# Patient Record
Sex: Female | Born: 1970
Health system: Southern US, Community
[De-identification: ages and names within clinical notes are randomized; demographics above are authoritative.]

## PROBLEM LIST (undated history)

## (undated) DIAGNOSIS — I10 Essential (primary) hypertension: Secondary | ICD-10-CM

---

## 2018-09-05 DIAGNOSIS — I10 Essential (primary) hypertension: Secondary | ICD-10-CM

## 2018-09-05 DIAGNOSIS — Z0130 Encounter for examination of blood pressure without abnormal findings: Secondary | ICD-10-CM

## 2018-09-05 LAB — GLUCOSE, POCT (MANUAL RESULT ENTRY): POC Glucose: 70 mg/dl (ref 70–99)

## 2018-09-05 NOTE — Congregational Nurse Program (Signed)
Betty Washington has come in for blood pressure check. Her BP is high, she denies chest pain or discomfort. She denies shortness of breath but does c/o occasional headaches. I have referred her to [facility] and [facility] card assistance as well. Arman Bogus RN BSN PCCN  [phone]

## 2018-09-06 ENCOUNTER — Encounter: Payer: Self-pay | Admitting: Critical Care Medicine

## 2018-09-06 ENCOUNTER — Other Ambulatory Visit: Payer: Self-pay | Admitting: Critical Care Medicine

## 2018-09-06 MED ORDER — AMLODIPINE BESYLATE 10 MG PO TABS: 10.0000 mg | ORAL_TABLET | Freq: Every day | ORAL | 0 refills | Status: DC

## 2018-09-06 MED ORDER — HYDROCHLOROTHIAZIDE 12.5 MG PO TABS: 12.5000 mg | ORAL_TABLET | Freq: Every day | ORAL | 0 refills | Status: DC

## 2018-09-06 NOTE — Progress Notes (Signed)
HTN   bp 172/104

## 2018-09-07 MED FILL — AMLODIPINE BESYLATE 10 MG T: 10 | 30 days supply | Qty: 30 | Fill #0

## 2018-09-07 MED FILL — HYDROCHLOROTHIAZIDE 12.5 MG: 12.5 | 30 days supply | Qty: 30 | Fill #0

## 2018-09-07 NOTE — Progress Notes (Signed)
This is a 48 year old African female originally from [place] for the and here in the [place] for the past 2 years.  The patient is referred by a local Congregational nurse to see here in the [place] clinic.  The patient has hypertension.  Blood pressure by the local nurse was 180/110 and here was 172/104.  The patient denies any previous known history of hypertension.  She denies headache.  There is no lower extremity edema.  She has occasional left leg pain.  There is no chest pain or shortness of breath.  No previous history of stroke or coronary disease.  She works as a Advertising account executive.  She has no insurance.  She lives in housing.  She uses buses for transportation.  She does not smoke or drink alcohol.  No prior surgical history.  No medication allergies.   On exam blood pressure 172/104  pulse is 62 saturation 99% room air  Chest showed to be clear without evidence of wheeze rale or rhonchi Cardiac exam showed a regular rate and rhythm without S3 normal S1-S2 Neurologic exam is intact Skin was clear HEENT exam unremarkable  Impression is that of hypertension newly diagnosed Plan will be to begin amlodipine 10 mg daily and hydrochlorothiazide 12.5 mg daily these were sent to the [facility] outpatient clinic under the Congregational nurse find for pickup and delivery to this patient  We will also have the patient return in 1 week to the [facility] for follow-up and eventually get this patient into a primary care office here locally for further follow-up

## 2018-09-09 NOTE — Congregational Nurse Program (Signed)
Client seen by Dr. Delford Field at clinic for R/O hypertensive event. Rx sent to [facility], CN will secure medication and review administration with client. Follow up scheduled for subsequent clinic date with Dr.Wright

## 2018-09-10 NOTE — Congregational Nurse Program (Signed)
  Dept: [phone]   Congregational Nurse Program Note  Date of Encounter: 09/05/2018  Past Medical History: No past medical history on file.  Encounter Details: CNP Questionnaire - 09/05/18 1608      Questionnaire   Patient Status  Not Applicable    Race  African    Location Patient Served At  [employer]  Not Applicable    Uninsured  Uninsured (NEW 1x/quarter)    Food  No food insecurities    Housing/Utilities  Yes, have permanent housing    Transportation  Yes, need transportation assistance    Interpersonal Safety  Yes, feel physically and emotionally safe where you currently live    Medication  Yes, have medication insecurities    Medical Provider  No    Referrals  [facility];Urgent Care    ED Visit Averted  Yes    Life-Saving Intervention Made  Not Applicable

## 2018-09-12 ENCOUNTER — Telehealth: Payer: Self-pay

## 2018-09-12 NOTE — Telephone Encounter (Signed)
I have called Betty Washington to check on her. She says that medicine is "giving me a headache". She has missed taking her medication on 3 occassions. I have educated her on the importance of being compliant. She verbalized understanding and says that she will take medication as prescribed and will be seeing Dr Delford Field tomorrow for review.

## 2018-09-13 ENCOUNTER — Other Ambulatory Visit: Payer: Self-pay | Admitting: Critical Care Medicine

## 2018-09-13 ENCOUNTER — Encounter: Payer: Self-pay | Admitting: Critical Care Medicine

## 2018-09-13 NOTE — Progress Notes (Signed)
BP 130/92!!!!

## 2018-09-14 NOTE — Progress Notes (Signed)
This is a 48 year old African female originally from [place] for the and here in the [place] for the past 2 years.  The patient is referred by a local Congregational nurse to see here in the [place] clinic.  The patient was seen on 19 February for hypertension and was noted to have Blood pressure by the local nurse was 180/110 and here was 172/104.  We began this patient on amlodipine 10 mg daily and hydrochlorthiazide 12 and half milligrams daily.  The patient has been on these medications for the past week.  She states she has had an increase diuresis.  On exam blood pressure is now 130/92 pulse is 79 saturation 97% room air  Her exam is unremarkable  Impression is that of improvement in hypertension control with amlodipine and hydrochlorthiazide  We will encourage this patient to make an appointment at the community health and wellness center to [place] address her primary care needs and get her accessed for further ongoing care along with laboratory studies

## 2018-09-19 ENCOUNTER — Telehealth: Payer: Self-pay

## 2018-09-19 NOTE — Telephone Encounter (Signed)
I have received a call from Ms Betty Washington to report that she is feeling much better and taking meds as ordered. She has no questions or concerns at this time. Arman Bogus Rn BSN PCCN ##[phone]

## 2018-10-03 ENCOUNTER — Ambulatory Visit: Payer: Self-pay | Admitting: Critical Care Medicine

## 2018-10-03 NOTE — Progress Notes (Deleted)
   Subjective:    Patient ID: Betty Washington, female    DOB: [birth year], 48 y.o.   MRN: [id number]  48 y.o.F here to establish PCP and hx of HTN    This is a 48 year old African female originally from [place] for the and here in the [place] for the past 2 years.  The patient is referred by a local Congregational nurse to see here in the [place] clinic.  The patient was seen on 19 February for hypertension and was noted to have Blood pressure by the local nurse was 180/110 and here was 172/104.  We began this patient on amlodipine 10 mg daily and hydrochlorthiazide 12 and half milligrams daily.  The patient has been on these medications for the past week.  She states she has had an increase diuresis.  On exam blood pressure is now 130/92 pulse is 79 saturation 97% room air  Her exam is unremarkable  Impression is that of improvement in hypertension control with amlodipine and hydrochlorthiazide  We will encourage this patient to make an appointment at the community health and wellness center to [place] address her primary care needs and get her accessed for further ongoing care along with laboratory studies      Review of Systems     Objective:   Physical Exam        Assessment & Plan:

## 2020-12-06 ENCOUNTER — Encounter (HOSPITAL_COMMUNITY): Payer: Self-pay | Admitting: Emergency Medicine

## 2020-12-06 ENCOUNTER — Ambulatory Visit (HOSPITAL_COMMUNITY)
Admission: EM | Admit: 2020-12-06 | Discharge: 2020-12-06 | Disposition: A | Discharge status: Home / Self Care | Payer: Self-pay

## 2020-12-06 ENCOUNTER — Telehealth (HOSPITAL_COMMUNITY): Payer: Self-pay

## 2020-12-06 ENCOUNTER — Ambulatory Visit (INDEPENDENT_AMBULATORY_CARE_PROVIDER_SITE_OTHER): Payer: Self-pay

## 2020-12-06 ENCOUNTER — Other Ambulatory Visit: Payer: Self-pay

## 2020-12-06 DIAGNOSIS — M25512 Pain in left shoulder: Secondary | ICD-10-CM

## 2020-12-06 DIAGNOSIS — M7552 Bursitis of left shoulder: Secondary | ICD-10-CM

## 2020-12-06 HISTORY — DX: Essential (primary) hypertension: I10

## 2020-12-06 MED ORDER — ACETAMINOPHEN 500 MG PO TABS: 1000.0000 mg | ORAL_TABLET | Freq: Three times a day (TID) | ORAL | 0 refills | Status: AC | PRN

## 2020-12-06 MED ORDER — PREDNISONE 20 MG PO TABS: 40.0000 mg | ORAL_TABLET | Freq: Every day | ORAL | 0 refills | Status: AC

## 2020-12-06 MED ORDER — DICLOFENAC SODIUM 1 % EX GEL: 4.0000 g | Freq: Four times a day (QID) | CUTANEOUS | 0 refills | Status: AC

## 2020-12-06 MED ORDER — TIZANIDINE HCL 2 MG PO CAPS: 2.0000 mg | ORAL_CAPSULE | Freq: Three times a day (TID) | ORAL | 0 refills | Status: DC

## 2020-12-06 NOTE — Discharge Instructions (Addendum)
-  You have bursitis of your left shoulder. This is inflammation of the fluid-filled pads (bursae) that act as cushions at the joints. -Start the Prednisone, 2 pills taken at the same time for 5 days in a row.  Try taking this earlier in the day as it can give you energy.  This will help decrease the inflammation and swelling in your shoulder.  Avoid taking ibuprofen on this medication as this can increase your chance of stomach upset and gastritis. -Start the muscle relaxer-Zanaflex (tizanidine), up to 3 times daily for muscle spasms and pain.  This can make you drowsy, so take at bedtime or when you do not need to drive or operate machinery. This will help the tense muscles around your shoulder relax. -Tylenol 1000 mg (2 pills) up to 3 times daily. -Voltaren gel applied to the shoulder, up to 4 times daily. This will help with pain. -Use your sling throughout the day to prevent the shoulder from moving and hurting. -Follow-up with orthopedist if symptoms persist, information below.  Call them on 5/23 if your symptoms are not improving by this time.

## 2020-12-06 NOTE — ED Provider Notes (Signed)
[facility]    CSN: [id number] Arrival date & time: 12/06/20  1218      History   Chief Complaint Chief Complaint  Patient presents with  . Shoulder Pain    HPI Jerriann Manninen is a 50 y.o. female presenting with L shoulder issue x3 days.  Medical history hypertension.  States the left shoulder has been bothering her for some time, she thought this was just irritation from her bra strap.  She states that this is gotten significantly worse over the last 3 days.  States she woke up 3 days ago and the shoulder was swollen and extremely painful.  She is able to move it but with excruciating pain.  Ibuprofen provides minimal relief.  Denies trauma, overuse.  She is right-handed.  Denies numbness and tingling, weakness.  Denies pain elsewhere, chest pain, dizziness, shortness of breath, fevers/chills.  HPI  Past Medical History:  Diagnosis Date  . Hypertension     There are no problems to display for this patient.   History reviewed. No pertinent surgical history.  OB History   No obstetric history on file.      Home Medications    Prior to Admission medications   Medication Sig Start Date End Date Taking? Authorizing Provider  acetaminophen (TYLENOL) 500 MG tablet Take 2 tablets (1,000 mg total) by mouth every 8 (eight) hours as needed for up to 7 days. 12/06/20 12/13/20 Yes Rhys Martini, PA-C  amLODipine (NORVASC) 10 MG tablet Take 1 tablet (10 mg total) by mouth daily. 09/06/18  Yes Storm Frisk, MD  diclofenac Sodium (VOLTAREN) 1 % GEL Apply 4 g topically 4 (four) times daily for 7 days. 12/06/20 12/13/20 Yes Rhys Martini, PA-C  hydrochlorothiazide (HYDRODIURIL) 12.5 MG tablet Take 1 tablet (12.5 mg total) by mouth daily. 09/06/18  Yes Storm Frisk, MD  predniSONE (DELTASONE) 20 MG tablet Take 2 tablets (40 mg total) by mouth daily for 5 days. 12/06/20 12/11/20 Yes Rhys Martini, PA-C  tizanidine (ZANAFLEX) 2 MG capsule Take 1 capsule (2 mg total) by  mouth 3 (three) times daily. 12/06/20  Yes Rhys Martini, PA-C    Family History History reviewed. No pertinent family history.  Social History Social History   Tobacco Use  . Smoking status: Never Smoker  . Smokeless tobacco: Never Used  Vaping Use  . Vaping Use: Never used  Substance Use Topics  . Alcohol use: Never  . Drug use: Never     Allergies   Patient has no known allergies.   Review of Systems Review of Systems  Constitutional: Negative for chills, fever and unexpected weight change.  Respiratory: Negative for chest tightness and shortness of breath.   Cardiovascular: Negative for chest pain and palpitations.  Gastrointestinal: Negative for abdominal pain, diarrhea, nausea and vomiting.  Genitourinary: Negative for decreased urine volume, difficulty urinating and frequency.  Musculoskeletal: Negative for arthralgias, back pain, gait problem, joint swelling, myalgias, neck pain and neck stiffness.       L shoulder pain  Skin: Negative for wound.  Neurological: Negative for dizziness, tremors, seizures, syncope, facial asymmetry, speech difficulty, weakness, light-headedness, numbness and headaches.  All other systems reviewed and are negative.    Physical Exam Triage Vital Signs ED Triage Vitals  Enc Vitals Group     BP 12/06/20 1442 (!) 164/79     Pulse Rate 12/06/20 1442 71     Resp 12/06/20 1442 20     Temp 12/06/20 1442 99  F (37.2 C)     Temp Source 12/06/20 1442 Oral     SpO2 12/06/20 1442 100 %     Weight --      Height --      Head Circumference --      Peak Flow --      Pain Score 12/06/20 1438 10     Pain Loc --      Pain Edu? --      Excl. in GC? --    No data found.  Updated Vital Signs BP (!) 164/79 (BP Location: Right Arm) Comment (BP Location): large cuff  Pulse 71   Temp 99 F (37.2 C) (Oral)   Resp 20   LMP 11/06/2020   SpO2 100%   Visual Acuity Right Eye Distance:   Left Eye Distance:   Bilateral Distance:    Right  Eye Near:   Left Eye Near:    Bilateral Near:     Physical Exam Vitals reviewed.  Constitutional:      General: She is not in acute distress.    Appearance: Normal appearance. She is not ill-appearing or diaphoretic.  HENT:     Head: Normocephalic and atraumatic.  Cardiovascular:     Rate and Rhythm: Normal rate and regular rhythm.     Heart sounds: Normal heart sounds.  Pulmonary:     Effort: Pulmonary effort is normal.     Breath sounds: Normal breath sounds.  Musculoskeletal:     Comments: Exam limited due to patient discomfort.  Left shoulder with effusion to the anterior aspect.  Diffuse tenderness to palpation over shoulder, deltoid muscle, left proximal trapezius muscle.  Range of motion arm is limited due to pain.  No elbow tenderness, wrist tenderness.  Range of motion elbow and wrist intact and without pain.  Grip strength 5 out of 5.  Radial pulse 2+, cap refill is in 2 seconds.  Skin:    General: Skin is warm.     Capillary Refill: Capillary refill takes less than 2 seconds.  Neurological:     General: No focal deficit present.     Mental Status: She is alert and oriented to person, place, and time.  Psychiatric:        Mood and Affect: Mood normal. Affect is tearful.        Behavior: Behavior normal.        Thought Content: Thought content normal.        Judgment: Judgment normal.      UC Treatments / Results  Labs (all labs ordered are listed, but only abnormal results are displayed) Labs Reviewed - No data to display  EKG   Radiology DG Shoulder Left  Result Date: 12/06/2020 CLINICAL DATA:  Acute left shoulder pain without known injury. EXAM: LEFT SHOULDER - 2+ VIEW COMPARISON:  None. FINDINGS: There is no evidence of fracture or dislocation. There is no evidence of arthropathy. Lobular calcification is seen near the superior portion of the glenoid fossa which may represent bursal calcification. IMPRESSION: No fracture or dislocation is noted. Possible  large bursal calcification is noted suggesting bursitis. Electronically Signed   By: Lupita Raider M.D.   On: 12/06/2020 15:26    Procedures Procedures (including critical care time)  Medications Ordered in UC Medications - No data to display  Initial Impression / Assessment and Plan / UC Course  I have reviewed the triage vital signs and the nursing notes.  Pertinent labs & imaging results that were available  during my care of the patient were reviewed by me and considered in my medical decision making (see chart for details).     This patient is a 50 year old female presenting with suspected left shoulder bursitis.  Neurovascularly intact.  Xray L shoulder - No fracture or dislocation is noted. Possible large bursal calcification is noted suggesting bursitis.  Placed in sling.  Voltaren gel, Tylenol, prednisone, Zanaflex.  Follow-up with Ortho.  Final Clinical Impressions(s) / UC Diagnoses   Final diagnoses:  Acute bursitis of left shoulder  Acute pain of left shoulder     Discharge Instructions     -You have bursitis of your left shoulder. This is inflammation of the fluid-filled pads (bursae) that act as cushions at the joints. -Start the Prednisone, 2 pills taken at the same time for 5 days in a row.  Try taking this earlier in the day as it can give you energy.  This will help decrease the inflammation and swelling in your shoulder.  Avoid taking ibuprofen on this medication as this can increase your chance of stomach upset and gastritis. -Start the muscle relaxer-Zanaflex (tizanidine), up to 3 times daily for muscle spasms and pain.  This can make you drowsy, so take at bedtime or when you do not need to drive or operate machinery. This will help the tense muscles around your shoulder relax. -Tylenol 1000 mg (2 pills) up to 3 times daily. -Voltaren gel applied to the shoulder, up to 4 times daily. This will help with pain. -Use your sling throughout the day to prevent  the shoulder from moving and hurting. -Follow-up with orthopedist if symptoms persist, information below.  Call them on 5/23 if your symptoms are not improving by this time.     ED Prescriptions    Medication Sig Dispense Auth. Provider   acetaminophen (TYLENOL) 500 MG tablet Take 2 tablets (1,000 mg total) by mouth every 8 (eight) hours as needed for up to 7 days. 30 tablet Rhys Martini, PA-C   predniSONE (DELTASONE) 20 MG tablet Take 2 tablets (40 mg total) by mouth daily for 5 days. 10 tablet Rhys Martini, PA-C   diclofenac Sodium (VOLTAREN) 1 % GEL Apply 4 g topically 4 (four) times daily for 7 days. 120 g Rhys Martini, PA-C   tizanidine (ZANAFLEX) 2 MG capsule Take 1 capsule (2 mg total) by mouth 3 (three) times daily. 21 capsule Rhys Martini, PA-C     PDMP not reviewed this encounter.   Rhys Martini, PA-C 12/06/20 1617

## 2020-12-06 NOTE — ED Triage Notes (Signed)
Patient reports waking from sleep on Friday and having left shoulder pain.  Pain has continued.  Patient reports pain in left shoulder to left elbow.  Left radial pulse 2 +,  Patient wiggles fingers without issue.  States she cannot move elbow or shoulder

## 2021-01-22 ENCOUNTER — Ambulatory Visit: Payer: Self-pay | Admitting: Physician Assistant

## 2021-02-04 ENCOUNTER — Other Ambulatory Visit: Payer: Self-pay

## 2021-02-04 ENCOUNTER — Encounter (INDEPENDENT_AMBULATORY_CARE_PROVIDER_SITE_OTHER): Payer: Self-pay | Admitting: Primary Care

## 2021-02-04 ENCOUNTER — Other Ambulatory Visit (HOSPITAL_COMMUNITY): Payer: Self-pay

## 2021-02-04 ENCOUNTER — Ambulatory Visit (INDEPENDENT_AMBULATORY_CARE_PROVIDER_SITE_OTHER): Payer: Self-pay | Admitting: Primary Care

## 2021-02-04 VITALS — BP 149/86 | HR 65 | Temp 98.1°F | Wt 210.4 lb

## 2021-02-04 DIAGNOSIS — Z13220 Encounter for screening for lipoid disorders: Secondary | ICD-10-CM

## 2021-02-04 DIAGNOSIS — E049 Nontoxic goiter, unspecified: Secondary | ICD-10-CM

## 2021-02-04 DIAGNOSIS — I10 Essential (primary) hypertension: Secondary | ICD-10-CM

## 2021-02-04 DIAGNOSIS — Z7689 Persons encountering health services in other specified circumstances: Secondary | ICD-10-CM

## 2021-02-04 MED ORDER — HYDROCHLOROTHIAZIDE 12.5 MG PO TABS
12.5000 mg | ORAL_TABLET | Freq: Every day | ORAL | 1 refills | Status: DC
  Filled 2021-02-04: qty 30, 30d supply, fill #0

## 2021-02-04 MED ORDER — AMLODIPINE BESYLATE 10 MG PO TABS
10.0000 mg | ORAL_TABLET | Freq: Every day | ORAL | 1 refills | Status: AC
  Filled 2021-02-04: qty 30, 30d supply, fill #0

## 2021-02-04 NOTE — Addendum Note (Signed)
Addended by: Gwinda Passe on: 02/04/2021 03:07 PM   Modules accepted: Orders

## 2021-02-04 NOTE — Patient Instructions (Signed)

## 2021-02-04 NOTE — Progress Notes (Addendum)
  [facility]   Subjective:   [name] is a 50 y.o. female presents for establish care. Denies any questions , problems or concerns.  Past Medical History:  Diagnosis Date   Hypertension      No Known Allergies    Current Outpatient Medications on File Prior to Visit  Medication Sig Dispense Refill   amLODipine (NORVASC) 10 MG tablet Take 1 tablet (10 mg total) by mouth daily. (Patient not taking: Reported on 02/04/2021) 30 tablet 0   hydrochlorothiazide (HYDRODIURIL) 12.5 MG tablet Take 1 tablet (12.5 mg total) by mouth daily. (Patient not taking: Reported on 02/04/2021) 30 tablet 0   No current facility-administered medications on file prior to visit.     Review of System: Review of Systems  Neurological:  Positive for headaches.  All other systems reviewed and are negative.  Objective:  BP (!) 149/86 (BP Location: Right Arm, Patient Position: Sitting, Cuff Size: Large)   Pulse 65   Temp 98.1 F (36.7 C) (Temporal)   Wt 210 lb 6.4 oz (95.4 kg)   SpO2 94%   Filed Weights   02/04/21 1425  Weight: 210 lb 6.4 oz (95.4 kg)    Physical Exam: General Appearance: Well nourished, in no apparent distress. Eyes: PERRLA, EOMs, conjunctiva no swelling or erythema Sinuses: No Frontal/maxillary tenderness ENT/Mouth: Ext aud canals clear, TMs without erythema, bulging. No erythema, swelling, or exudate on post pharynx.  Tonsils not swollen or erythematous. Hearing normal.  Neck: Supple, goiter  Respiratory: Respiratory effort normal, BS equal bilaterally without rales, rhonchi, wheezing or stridor.  Cardio: RRR with no MRGs. Brisk peripheral pulses without edema.  Abdomen: Soft, + BS.  Non tender, no guarding, rebound, hernias, masses. Lymphatics: Non tender without lymphadenopathy.  Musculoskeletal: Full ROM, 5/5 strength, normal gait.  Skin: Warm, dry without rashes, lesions, ecchymosis.  Neuro: Cranial nerves intact. Normal muscle tone, no cerebellar  symptoms. Sensation intact.  Psych: Awake and oriented X 3, normal affect, Insight and Judgment appropriate.    Assessment:     This note has been created with Education officer, environmental. Any transcriptional errors are unintentional.   Grayce Sessions, NP 02/04/2021, 2:29 PM

## 2021-03-11 ENCOUNTER — Encounter (INDEPENDENT_AMBULATORY_CARE_PROVIDER_SITE_OTHER): Payer: Self-pay | Admitting: Primary Care

## 2021-03-11 ENCOUNTER — Other Ambulatory Visit (INDEPENDENT_AMBULATORY_CARE_PROVIDER_SITE_OTHER): Payer: Self-pay

## 2021-03-11 ENCOUNTER — Other Ambulatory Visit: Payer: Self-pay

## 2021-03-11 ENCOUNTER — Other Ambulatory Visit (HOSPITAL_COMMUNITY): Payer: Self-pay

## 2021-03-11 ENCOUNTER — Ambulatory Visit (INDEPENDENT_AMBULATORY_CARE_PROVIDER_SITE_OTHER): Payer: Self-pay | Admitting: Primary Care

## 2021-03-11 VITALS — BP 165/98 | HR 59 | Temp 97.7°F | Ht 68.0 in | Wt 209.6 lb

## 2021-03-11 DIAGNOSIS — E049 Nontoxic goiter, unspecified: Secondary | ICD-10-CM

## 2021-03-11 DIAGNOSIS — I10 Essential (primary) hypertension: Secondary | ICD-10-CM

## 2021-03-11 DIAGNOSIS — Z1211 Encounter for screening for malignant neoplasm of colon: Secondary | ICD-10-CM

## 2021-03-11 DIAGNOSIS — Z13220 Encounter for screening for lipoid disorders: Secondary | ICD-10-CM

## 2021-03-11 MED ORDER — HYDROCHLOROTHIAZIDE 25 MG PO TABS
25.0000 mg | ORAL_TABLET | Freq: Every day | ORAL | 3 refills | Status: AC
  Filled 2021-03-11: qty 90, 90d supply, fill #0

## 2021-03-11 NOTE — Progress Notes (Signed)
[facility]   Betty Washington is a 50 y.o. female presents for hypertension evaluation, Denies shortness of breath, headaches, chest pain or lower extremity edema, sudden onset, vision changes, unilateral weakness, dizziness, paresthesias .  Patient did not take her blood pressure medication today because she came in fasting for labs and did not know if she needed to take medication prior to visit.  She does take her blood pressures at home and her systolic ranges from 130-142 and her diastolic 80-90 ranges from  Patient reports adherence with medications.  Dietary habits include: changed stop using mega cube Exercise habits include:walking Family / Social history: No   Past Medical History:  Diagnosis Date   Hypertension    No past surgical history on file. No Known Allergies Current Outpatient Medications on File Prior to Visit  Medication Sig Dispense Refill   amLODipine (NORVASC) 10 MG tablet Take 1 tablet (10 mg total) by mouth daily. 90 tablet 1   hydrochlorothiazide (HYDRODIURIL) 12.5 MG tablet Take 1 tablet (12.5 mg total) by mouth daily. 90 tablet 1   No current facility-administered medications on file prior to visit.   Social History   Socioeconomic History   Marital status: Legally Separated    Spouse name: Not on file   Number of children: Not on file   Years of education: Not on file   Highest education level: Not on file  Occupational History   Not on file  Tobacco Use   Smoking status: Never   Smokeless tobacco: Never  Vaping Use   Vaping Use: Never used  Substance and Sexual Activity   Alcohol use: Never   Drug use: Never   Sexual activity: Not on file  Other Topics Concern   Not on file  Social History Narrative   Not on file   Social Determinants of Health   Financial Resource Strain: Not on file  Food Insecurity: Not on file  Transportation Needs: Not on file  Physical Activity: Not on file  Stress: Not on file  Social  Connections: Not on file  Intimate Partner Violence: Not on file   No family history on file.   OBJECTIVE:  Vitals:   03/11/21 0957 03/11/21 1012  BP: (!) 163/103 (!) 165/98  Pulse: (!) 58 (!) 59  Temp: 97.7 F (36.5 C)   TempSrc: Temporal   SpO2: 95%   Weight: 209 lb 9.6 oz (95.1 kg)   Height: 5\' 8"  (1.727 m)     Physical Exam Vitals reviewed.  Constitutional:      Appearance: She is obese.  HENT:     Head: Normocephalic.     Right Ear: Tympanic membrane and external ear normal.     Left Ear: Tympanic membrane and external ear normal.     Nose: Nose normal.  Eyes:     Extraocular Movements: Extraocular movements intact.     Pupils: Pupils are equal, round, and reactive to light.  Neck:     Comments: Thick  Cardiovascular:     Rate and Rhythm: Normal rate and regular rhythm.  Pulmonary:     Effort: Pulmonary effort is normal.     Breath sounds: Normal breath sounds.  Abdominal:     General: Bowel sounds are normal. There is distension.     Palpations: Abdomen is soft.  Musculoskeletal:        General: Normal range of motion.     Cervical back: Normal range of motion and neck supple.  Skin:  General: Skin is warm and dry.  Neurological:     Mental Status: She is alert and oriented to person, place, and time.  Psychiatric:        Mood and Affect: Mood normal.        Behavior: Behavior normal.        Thought Content: Thought content normal.        Judgment: Judgment normal.     ROS  Last 3 Office BP readings: BP Readings from Last 3 Encounters:  03/11/21 (!) 165/98  02/04/21 (!) 149/86  12/06/20 (!) 164/79    BMET No results found for: NA, K, CL, CO2, GLUCOSE, BUN, CREATININE, CALCIUM, GFRNONAA, GFRAA  Renal function: CrCl cannot be calculated (No successful lab value found.).  Clinical ASCVD: No  The ASCVD Risk score Denman George DC Jr., et al., 2013) failed to calculate for the following reasons:   Cannot find a previous HDL lab   Cannot find a  previous total cholesterol lab  Khyler was seen today for blood pressure check.  Diagnoses and all orders for this visit:  ASSESSMENT & PLAN:   Essential hypertension -Counseled on lifestyle modifications for blood pressure control including reduced dietary sodium, increased exercise, weight reduction and adequate sleep. Also, educated patient about the risk for cardiovascular events, stroke and heart attack. Also counseled patient about the importance of medication adherence. If you participate in smoking, it is important to stop using tobacco as this will increase the risks associated with uncontrolled blood pressure.   -Hypertension longstanding  diagnosed currently amlodipine 10mg  and HCTZ 12.5 mg on current medications. Patient is adherent with current medications.  hydrochlorothiazide (HYDRODIURIL) 25 MG tablet; Take 1 tablet (25 mg total) by mouth daily.  Goal BP:  For patients younger than 60: Goal BP < 130/80. For patients 60 and older: Goal BP < 140/90. For patients with diabetes: Goal BP < 130/80. Your most recent BP: 165/98  Minimize salt intake. Minimize alcohol intake -      Colon cancer screening Fecal occult blood, imunochemical; Future  -    This note has been created with . Any transcriptional errors are unintentional.   Education officer, environmental, NP 03/11/2021, 10:18 AM

## 2021-03-11 NOTE — Patient Instructions (Addendum)
Hydrochlorothiazide 12.5mg  has been increased to 25 mg daily.  She will take 2(12.5 mg tablets) until gone and the prescription she picks up will be 25 mg HCTZ take 1 daily.  Hypertension, Adult High blood pressure (hypertension) is when the force of blood pumping through the arteries is too strong. The arteries are the blood vessels that carry blood from the heart throughout the body. Hypertension forces the heart to work harder to pump blood and may cause arteries to become narrow or stiff. Untreated or uncontrolled hypertension can cause a heart attack, heart failure, a stroke, kidney disease, and otherproblems. A blood pressure reading consists of a higher number over a lower number. Ideally, your blood pressure should be below 120/80. The first ("top") number is called the systolic pressure. It is a measure of the pressure in your arteries as your heart beats. The second ("bottom") number is called the diastolic pressure. It is a measure of the pressure in your arteries as theheart relaxes. What are the causes? The exact cause of this condition is not known. There are some conditions thatresult in or are related to high blood pressure. What increases the risk? Some risk factors for high blood pressure are under your control. The following factors may make you more likely to develop this condition: Smoking. Having type 2 diabetes mellitus, high cholesterol, or both. Not getting enough exercise or physical activity. Being overweight. Having too much fat, sugar, calories, or salt (sodium) in your diet. Drinking too much alcohol. Some risk factors for high blood pressure may be difficult or impossible to change. Some of these factors include: Having chronic kidney disease. Having a family history of high blood pressure. Age. Risk increases with age. Race. You may be at higher risk if you are African American. Gender. Men are at higher risk than women before age 85. After age 46, women are at  higher risk than men. Having obstructive sleep apnea. Stress. What are the signs or symptoms? High blood pressure may not cause symptoms. Very high blood pressure (hypertensive crisis) may cause: Headache. Anxiety. Shortness of breath. Nosebleed. Nausea and vomiting. Vision changes. Severe chest pain. Seizures. How is this diagnosed? This condition is diagnosed by measuring your blood pressure while you are seated, with your arm resting on a flat surface, your legs uncrossed, and your feet flat on the floor. The cuff of the blood pressure monitor will be placed directly against the skin of your upper arm at the level of your heart. It should be measured at least twice using the same arm. Certain conditions cancause a difference in blood pressure between your right and left arms. Certain factors can cause blood pressure readings to be lower or higher than normal for a short period of time: When your blood pressure is higher when you are in a health care provider's office than when you are at home, this is called white coat hypertension. Most people with this condition do not need medicines. When your blood pressure is higher at home than when you are in a health care provider's office, this is called masked hypertension. Most people with this condition may need medicines to control blood pressure. If you have a high blood pressure reading during one visit or you have normal blood pressure with other risk factors, you may be asked to: Return on a different day to have your blood pressure checked again. Monitor your blood pressure at home for 1 week or longer. If you are diagnosed with hypertension, you may  have other blood or imaging tests to help your health care provider understand your overall risk for otherconditions. How is this treated? This condition is treated by making healthy lifestyle changes, such as eating healthy foods, exercising more, and reducing your alcohol intake. Your health  care provider may prescribe medicine if lifestyle changes are not enough to get your blood pressure under control, and if: Your systolic blood pressure is above 130. Your diastolic blood pressure is above 80. Your personal target blood pressure may vary depending on your medicalconditions, your age, and other factors. Follow these instructions at home: Eating and drinking  Eat a diet that is high in fiber and potassium, and low in sodium, added sugar, and fat. An example eating plan is called the DASH (Dietary Approaches to Stop Hypertension) diet. To eat this way: Eat plenty of fresh fruits and vegetables. Try to fill one half of your plate at each meal with fruits and vegetables. Eat whole grains, such as whole-wheat pasta, brown rice, or whole-grain bread. Fill about one fourth of your plate with whole grains. Eat or drink low-fat dairy products, such as skim milk or low-fat yogurt. Avoid fatty cuts of meat, processed or cured meats, and poultry with skin. Fill about one fourth of your plate with lean proteins, such as fish, chicken without skin, beans, eggs, or tofu. Avoid pre-made and processed foods. These tend to be higher in sodium, added sugar, and fat. Reduce your daily sodium intake. Most people with hypertension should eat less than 1,500 mg of sodium a day. Do not drink alcohol if: Your health care provider tells you not to drink. You are pregnant, may be pregnant, or are planning to become pregnant. If you drink alcohol: Limit how much you use to: 0-1 drink a day for women. 0-2 drinks a day for men. Be aware of how much alcohol is in your drink. In the [place] one drink equals one 12 oz bottle of beer (355 mL), one 5 oz glass of wine (148 mL), or one 1 oz glass of hard liquor (44 mL).  Lifestyle  Work with your health care provider to maintain a healthy body weight or to lose weight. Ask what an ideal weight is for you. Get at least 30 minutes of exercise most days of the week.  Activities may include walking, swimming, or biking. Include exercise to strengthen your muscles (resistance exercise), such as Pilates or lifting weights, as part of your weekly exercise routine. Try to do these types of exercises for 30 minutes at least 3 days a week. Do not use any products that contain nicotine or tobacco, such as cigarettes, e-cigarettes, and chewing tobacco. If you need help quitting, ask your health care provider. Monitor your blood pressure at home as told by your health care provider. Keep all follow-up visits as told by your health care provider. This is important.  Medicines Take over-the-counter and prescription medicines only as told by your health care provider. Follow directions carefully. Blood pressure medicines must be taken as prescribed. Do not skip doses of blood pressure medicine. Doing this puts you at risk for problems and can make the medicine less effective. Ask your health care provider about side effects or reactions to medicines that you should watch for. Contact a health care provider if you: Think you are having a reaction to a medicine you are taking. Have headaches that keep coming back (recurring). Feel dizzy. Have swelling in your ankles. Have trouble with your vision. Get help  right away if you: Develop a severe headache or confusion. Have unusual weakness or numbness. Feel faint. Have severe pain in your chest or abdomen. Vomit repeatedly. Have trouble breathing. Summary Hypertension is when the force of blood pumping through your arteries is too strong. If this condition is not controlled, it may put you at risk for serious complications. Your personal target blood pressure may vary depending on your medical conditions, your age, and other factors. For most people, a normal blood pressure is less than 120/80. Hypertension is treated with lifestyle changes, medicines, or a combination of both. Lifestyle changes include losing weight,  eating a healthy, low-sodium diet, exercising more, and limiting alcohol. This information is not intended to replace advice given to you by your health care provider. Make sure you discuss any questions you have with your healthcare provider. Document Revised: 03/15/2018 Document Reviewed: 03/15/2018 Elsevier Patient Education  2022 [employer].

## 2021-03-12 ENCOUNTER — Other Ambulatory Visit (HOSPITAL_COMMUNITY): Payer: Self-pay

## 2021-03-12 ENCOUNTER — Other Ambulatory Visit (INDEPENDENT_AMBULATORY_CARE_PROVIDER_SITE_OTHER): Payer: Self-pay | Admitting: Primary Care

## 2021-03-12 DIAGNOSIS — E782 Mixed hyperlipidemia: Secondary | ICD-10-CM

## 2021-03-12 LAB — CBC WITH DIFFERENTIAL/PLATELET
Basophils Absolute: 0 10*3/uL (ref 0.0–0.2)
Basos: 1 %
EOS (ABSOLUTE): 0.1 10*3/uL (ref 0.0–0.4)
Eos: 4 %
Hematocrit: 38.2 % (ref 34.0–46.6)
Hemoglobin: 12.4 g/dL (ref 11.1–15.9)
Immature Grans (Abs): 0 10*3/uL (ref 0.0–0.1)
Immature Granulocytes: 0 %
Lymphocytes Absolute: 1.7 10*3/uL (ref 0.7–3.1)
Lymphs: 46 %
MCH: 26.1 pg — ABNORMAL LOW (ref 26.6–33.0)
MCHC: 32.5 g/dL (ref 31.5–35.7)
MCV: 80 fL (ref 79–97)
Monocytes Absolute: 0.2 10*3/uL (ref 0.1–0.9)
Monocytes: 6 %
Neutrophils Absolute: 1.6 10*3/uL (ref 1.4–7.0)
Neutrophils: 43 %
Platelets: 246 10*3/uL (ref 150–450)
RBC: 4.75 x10E6/uL (ref 3.77–5.28)
RDW: 13.6 % (ref 11.7–15.4)
WBC: 3.6 10*3/uL (ref 3.4–10.8)

## 2021-03-12 LAB — LIPID PANEL
Chol/HDL Ratio: 3.9 ratio (ref 0.0–4.4)
Cholesterol, Total: 243 mg/dL — ABNORMAL HIGH (ref 100–199)
HDL: 63 mg/dL (ref >39)
LDL Chol Calc (NIH): 161 mg/dL — ABNORMAL HIGH (ref 0–99)
Triglycerides: 106 mg/dL (ref 0–149)
VLDL Cholesterol Cal: 19 mg/dL (ref 5–40)

## 2021-03-12 LAB — CMP14+EGFR
ALT: 18 IU/L (ref 0–32)
AST: 22 IU/L (ref 0–40)
Albumin/Globulin Ratio: 1.4 (ref 1.2–2.2)
Albumin: 4.7 g/dL (ref 3.8–4.8)
Alkaline Phosphatase: 63 IU/L (ref 44–121)
BUN/Creatinine Ratio: 19 (ref 9–23)
BUN: 11 mg/dL (ref 6–24)
Bilirubin Total: <0.2 mg/dL (ref 0.0–1.2)
CO2: 22 mmol/L (ref 20–29)
Calcium: 9.1 mg/dL (ref 8.7–10.2)
Chloride: 104 mmol/L (ref 96–106)
Creatinine, Ser: 0.58 mg/dL (ref 0.57–1.00)
Globulin, Total: 3.4 g/dL (ref 1.5–4.5)
Glucose: 95 mg/dL (ref 65–99)
Potassium: 4.5 mmol/L (ref 3.5–5.2)
Sodium: 140 mmol/L (ref 134–144)
Total Protein: 8.1 g/dL (ref 6.0–8.5)
eGFR: 111 mL/min/{1.73_m2} (ref >59)

## 2021-03-12 LAB — TSH+FREE T4
Free T4: 0.94 ng/dL (ref 0.82–1.77)
TSH: 1.21 u[IU]/mL (ref 0.450–4.500)

## 2021-03-12 MED ORDER — ATORVASTATIN CALCIUM 20 MG PO TABS
20.0000 mg | ORAL_TABLET | Freq: Every day | ORAL | 3 refills | Status: AC
  Filled 2021-03-12: qty 90, 90d supply, fill #0

## 2021-03-13 ENCOUNTER — Telehealth (INDEPENDENT_AMBULATORY_CARE_PROVIDER_SITE_OTHER): Payer: Self-pay

## 2021-03-13 ENCOUNTER — Encounter (INDEPENDENT_AMBULATORY_CARE_PROVIDER_SITE_OTHER): Payer: Self-pay

## 2021-03-13 NOTE — Addendum Note (Signed)
Addended by: Rogene Houston on: 03/13/2021 11:06 AM   Modules accepted: Orders

## 2021-03-13 NOTE — Telephone Encounter (Signed)
-----   Message from Grayce Sessions, NP sent at 03/12/2021  5:30 PM EDT ----- Labs are normal except cholesterol. This can lead to heart attack and stroke. To lower your number you can decrease your fatty foods, red meat, cheese, milk and increase fiber like whole grains and veggies. Sent in cholesterol medication take at bedtime atorvastatin 20mg

## 2021-03-13 NOTE — Telephone Encounter (Signed)
Attempted to reach patient; no answer. Left voicemail asking patient to return call. Per DPR contacted patients aunt. She was informed that labs were normal except cholesterol. Medication sent to help lower cholesterol. Take at bedtime. Advised to eat more fiber and whole grains an veggies. She will inform patient of results and instructions on medication and diet Tallyn Holroyd Budd Palmer, CMA

## 2021-03-16 LAB — FECAL OCCULT BLOOD, IMMUNOCHEMICAL: Fecal Occult Bld: NEGATIVE

## 2021-03-20 ENCOUNTER — Other Ambulatory Visit (HOSPITAL_COMMUNITY): Payer: Self-pay

## 2021-03-20 ENCOUNTER — Telehealth (INDEPENDENT_AMBULATORY_CARE_PROVIDER_SITE_OTHER): Payer: Self-pay

## 2021-03-20 NOTE — Telephone Encounter (Signed)
-----   Message from Grayce Sessions, NP sent at 03/16/2021  3:58 PM EDT ----- Negative FOBT repeat next year

## 2021-03-20 NOTE — Telephone Encounter (Signed)
Per DPR negative fecal results given to patients aunt. She will inform patient of results. Maryjean Morn, CMA

## 2021-04-06 ENCOUNTER — Ambulatory Visit (INDEPENDENT_AMBULATORY_CARE_PROVIDER_SITE_OTHER): Payer: Self-pay | Admitting: Primary Care

## 2021-04-08 ENCOUNTER — Ambulatory Visit (INDEPENDENT_AMBULATORY_CARE_PROVIDER_SITE_OTHER): Payer: Self-pay | Admitting: Primary Care

## 2021-04-28 ENCOUNTER — Ambulatory Visit (INDEPENDENT_AMBULATORY_CARE_PROVIDER_SITE_OTHER): Payer: Self-pay | Admitting: Primary Care

## 2021-12-02 ENCOUNTER — Ambulatory Visit (INDEPENDENT_AMBULATORY_CARE_PROVIDER_SITE_OTHER): Payer: Self-pay

## 2021-12-02 NOTE — Telephone Encounter (Signed)
Pt was given results from [facility] per Marcelino Duster, NP on 12/02/21. Pt didn't have any further questions and no further assistance needed.  ?

## 2021-12-02 NOTE — Telephone Encounter (Signed)
Patient called, left VM to return the call to the office to discuss lab results with a nurse. ?Summary: lab results  ? Pt is  calling in to get results of labs done 08/26. Please call back  ?  ? ? ?

## 2022-08-30 ENCOUNTER — Telehealth: Payer: Self-pay

## 2022-08-30 NOTE — Telephone Encounter (Signed)
Telephoned patient at mobile number. Left a voice message with [facility] ([facility]) contact information.

## 2022-11-24 IMAGING — DX DG SHOULDER 2+V*L*
3 series · 3 of 3 positions shown · non-contrast
Comparison: None.

CLINICAL DATA: Acute left shoulder pain without known injury.

EXAM:
LEFT SHOULDER - 2+ VIEW

[shoulder ap]
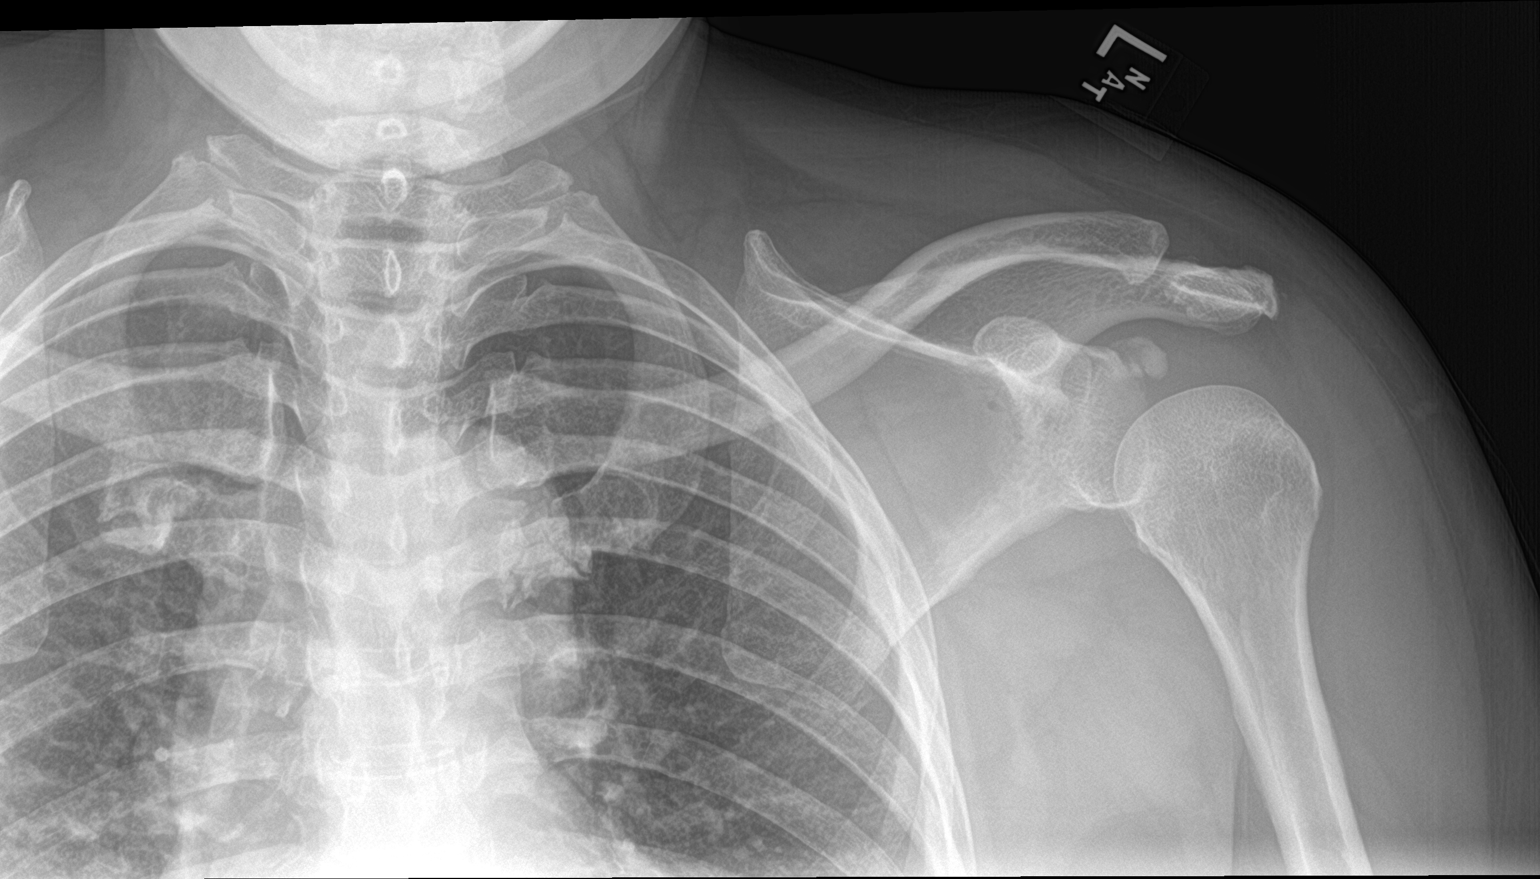

[shoulder grashey]
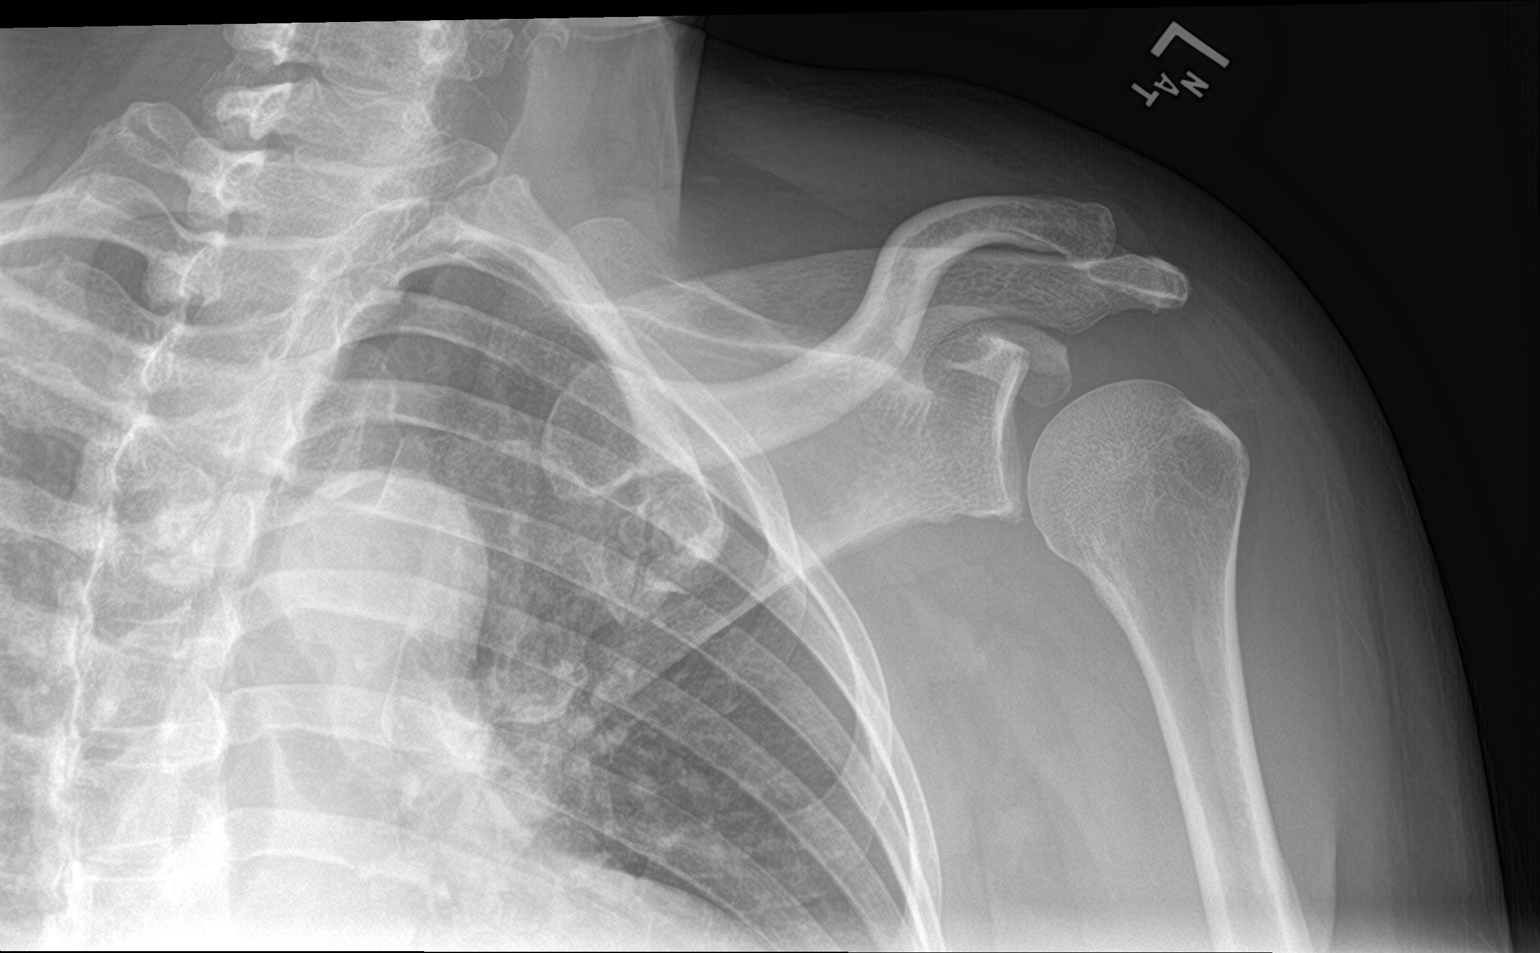

[shoulder y-view]
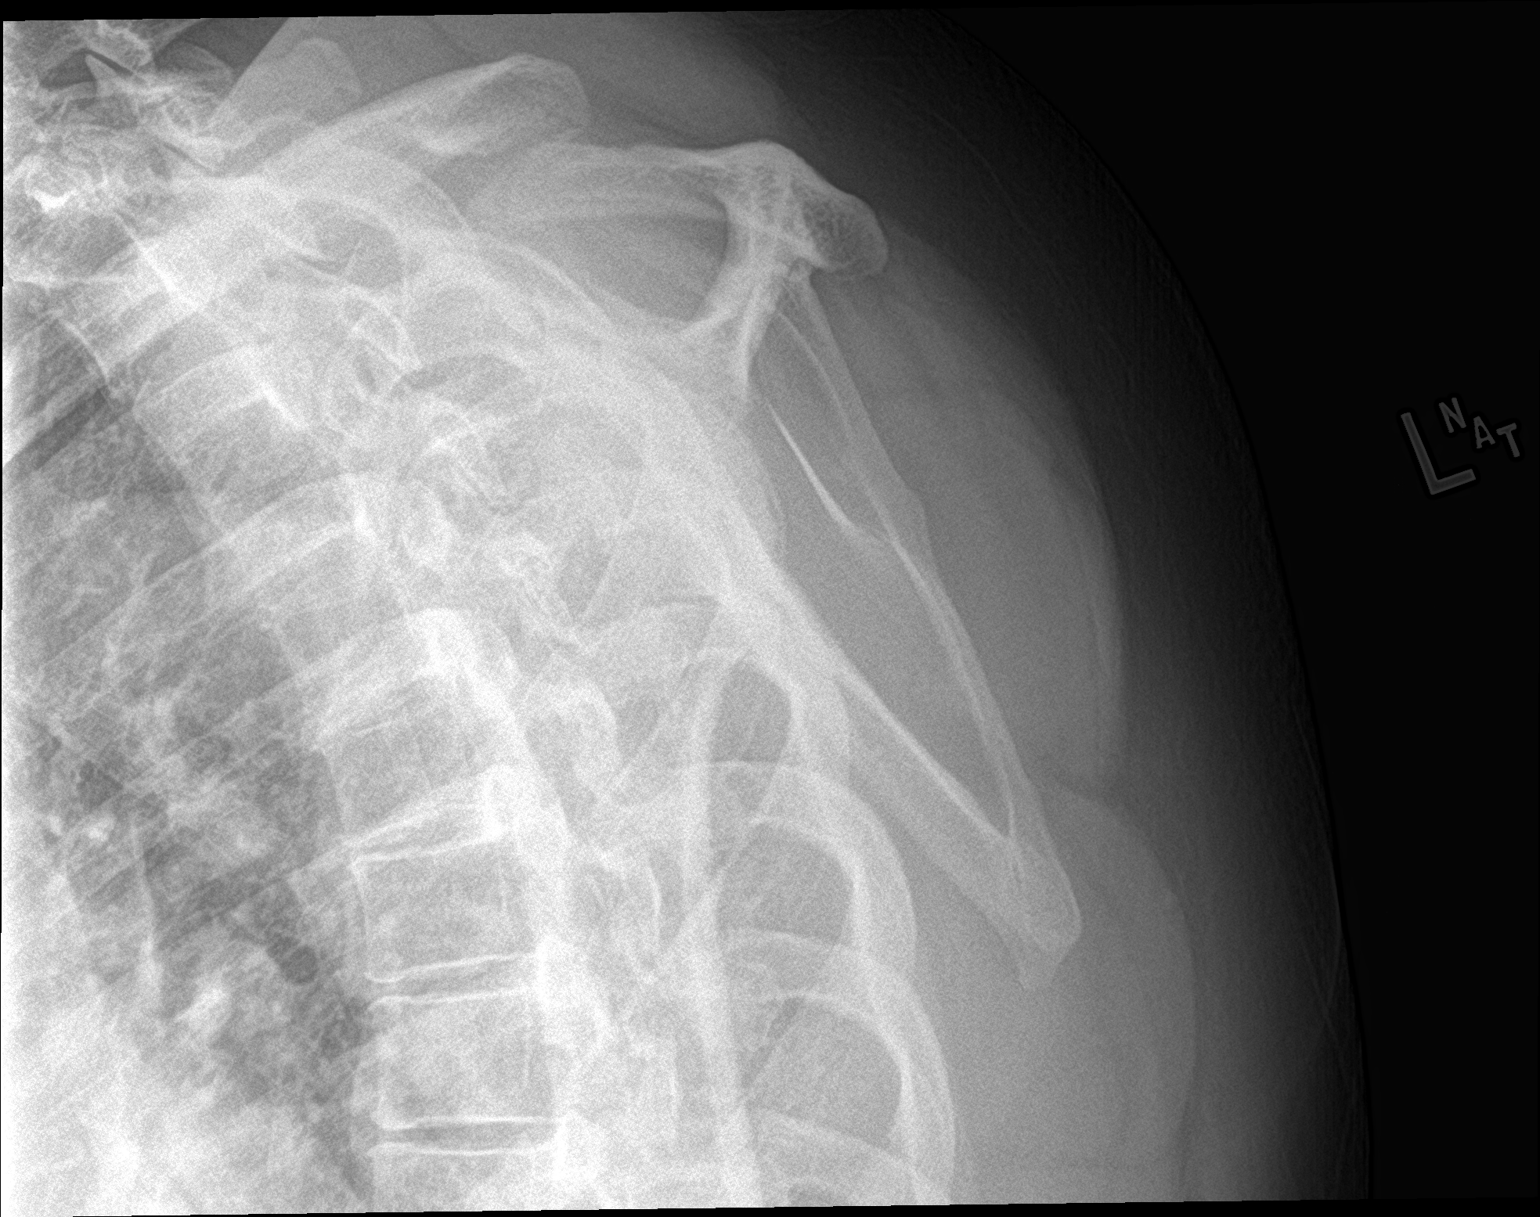

[3 of 3 positions shown; findings below may reference images not displayed]

FINDINGS: There is no evidence of fracture or dislocation. There is no
evidence of arthropathy. Lobular calcification is seen near the
superior portion of the glenoid fossa which may represent bursal
calcification.
IMPRESSION: No fracture or dislocation is noted. Possible large bursal
calcification is noted suggesting bursitis.
